# Patient Record
Sex: Male | Born: 1990 | Race: White | Hispanic: No | Marital: Single
Health system: Southern US, Community
[De-identification: ages and names within clinical notes are randomized; demographics above are authoritative.]

## PROBLEM LIST (undated history)

## (undated) DIAGNOSIS — F419 Anxiety disorder, unspecified: Secondary | ICD-10-CM

## (undated) HISTORY — PX: WRIST SURGERY: SHX841

---

## 2017-12-22 DIAGNOSIS — R030 Elevated blood-pressure reading, without diagnosis of hypertension: Secondary | ICD-10-CM | POA: Diagnosis not present

## 2017-12-22 DIAGNOSIS — R072 Precordial pain: Secondary | ICD-10-CM | POA: Diagnosis not present

## 2018-06-29 ENCOUNTER — Inpatient Hospital Stay (HOSPITAL_COMMUNITY)
Admission: EM | Admit: 2018-06-29 | Discharge: 2018-07-01 | DRG: 513 | Disposition: A | Discharge status: Home / Self Care | Payer: Commercial Managed Care - PPO | Attending: Student | Admitting: Student

## 2018-06-29 ENCOUNTER — Inpatient Hospital Stay (HOSPITAL_COMMUNITY): Payer: Commercial Managed Care - PPO | Admitting: Certified Registered"

## 2018-06-29 ENCOUNTER — Inpatient Hospital Stay (HOSPITAL_COMMUNITY): Payer: Commercial Managed Care - PPO

## 2018-06-29 ENCOUNTER — Encounter (HOSPITAL_COMMUNITY): Payer: Self-pay | Admitting: Emergency Medicine

## 2018-06-29 ENCOUNTER — Emergency Department (HOSPITAL_COMMUNITY): Payer: Commercial Managed Care - PPO

## 2018-06-29 ENCOUNTER — Encounter (HOSPITAL_COMMUNITY)
Admission: EM | Disposition: A | Discharge status: Home / Self Care | Payer: Self-pay | Source: Home / Self Care | Attending: Student

## 2018-06-29 DIAGNOSIS — S9031XA Contusion of right foot, initial encounter: Secondary | ICD-10-CM | POA: Diagnosis not present

## 2018-06-29 DIAGNOSIS — R079 Chest pain, unspecified: Secondary | ICD-10-CM | POA: Diagnosis not present

## 2018-06-29 DIAGNOSIS — S92221A Displaced fracture of lateral cuneiform of right foot, initial encounter for closed fracture: Principal | ICD-10-CM

## 2018-06-29 DIAGNOSIS — S3991XA Unspecified injury of abdomen, initial encounter: Secondary | ICD-10-CM | POA: Diagnosis not present

## 2018-06-29 DIAGNOSIS — F17220 Nicotine dependence, chewing tobacco, uncomplicated: Secondary | ICD-10-CM | POA: Diagnosis present

## 2018-06-29 DIAGNOSIS — S62022A Displaced fracture of middle third of navicular [scaphoid] bone of left wrist, initial encounter for closed fracture: Secondary | ICD-10-CM | POA: Diagnosis present

## 2018-06-29 DIAGNOSIS — Y92410 Unspecified street and highway as the place of occurrence of the external cause: Secondary | ICD-10-CM | POA: Diagnosis not present

## 2018-06-29 DIAGNOSIS — S82044C Nondisplaced comminuted fracture of right patella, initial encounter for open fracture type IIIA, IIIB, or IIIC: Secondary | ICD-10-CM | POA: Diagnosis not present

## 2018-06-29 DIAGNOSIS — S9781XA Crushing injury of right foot, initial encounter: Secondary | ICD-10-CM | POA: Diagnosis not present

## 2018-06-29 DIAGNOSIS — S62002A Unspecified fracture of navicular [scaphoid] bone of left wrist, initial encounter for closed fracture: Secondary | ICD-10-CM

## 2018-06-29 DIAGNOSIS — S81011A Laceration without foreign body, right knee, initial encounter: Secondary | ICD-10-CM

## 2018-06-29 DIAGNOSIS — M79641 Pain in right hand: Secondary | ICD-10-CM | POA: Diagnosis not present

## 2018-06-29 DIAGNOSIS — S299XXA Unspecified injury of thorax, initial encounter: Secondary | ICD-10-CM | POA: Diagnosis not present

## 2018-06-29 DIAGNOSIS — S6991XA Unspecified injury of right wrist, hand and finger(s), initial encounter: Secondary | ICD-10-CM | POA: Diagnosis not present

## 2018-06-29 DIAGNOSIS — S82044A Nondisplaced comminuted fracture of right patella, initial encounter for closed fracture: Secondary | ICD-10-CM | POA: Diagnosis not present

## 2018-06-29 DIAGNOSIS — S3993XA Unspecified injury of pelvis, initial encounter: Secondary | ICD-10-CM | POA: Diagnosis not present

## 2018-06-29 DIAGNOSIS — S92354A Nondisplaced fracture of fifth metatarsal bone, right foot, initial encounter for closed fracture: Secondary | ICD-10-CM | POA: Diagnosis not present

## 2018-06-29 DIAGNOSIS — S52502A Unspecified fracture of the lower end of left radius, initial encounter for closed fracture: Secondary | ICD-10-CM | POA: Diagnosis not present

## 2018-06-29 DIAGNOSIS — S82001B Unspecified fracture of right patella, initial encounter for open fracture type I or II: Secondary | ICD-10-CM | POA: Diagnosis not present

## 2018-06-29 DIAGNOSIS — S8991XA Unspecified injury of right lower leg, initial encounter: Secondary | ICD-10-CM | POA: Diagnosis not present

## 2018-06-29 DIAGNOSIS — S82001A Unspecified fracture of right patella, initial encounter for closed fracture: Secondary | ICD-10-CM

## 2018-06-29 DIAGNOSIS — R109 Unspecified abdominal pain: Secondary | ICD-10-CM | POA: Diagnosis not present

## 2018-06-29 DIAGNOSIS — F419 Anxiety disorder, unspecified: Secondary | ICD-10-CM | POA: Diagnosis present

## 2018-06-29 DIAGNOSIS — S6292XA Unspecified fracture of left wrist and hand, initial encounter for closed fracture: Secondary | ICD-10-CM

## 2018-06-29 DIAGNOSIS — S199XXA Unspecified injury of neck, initial encounter: Secondary | ICD-10-CM | POA: Diagnosis not present

## 2018-06-29 DIAGNOSIS — S82142A Displaced bicondylar fracture of left tibia, initial encounter for closed fracture: Secondary | ICD-10-CM

## 2018-06-29 DIAGNOSIS — S0990XA Unspecified injury of head, initial encounter: Secondary | ICD-10-CM | POA: Diagnosis not present

## 2018-06-29 HISTORY — PX: I&D EXTREMITY: SHX5045

## 2018-06-29 HISTORY — PX: ORIF SCAPHOID FRACTURE: SHX2130

## 2018-06-29 HISTORY — PX: ORIF PATELLA: SHX5033

## 2018-06-29 HISTORY — PX: IRRIGATION AND DEBRIDEMENT KNEE: SHX5185

## 2018-06-29 HISTORY — DX: Anxiety disorder, unspecified: F41.9

## 2018-06-29 HISTORY — PX: I & D EXTREMITY: SHX5045

## 2018-06-29 LAB — COMPREHENSIVE METABOLIC PANEL
ALT: 42 U/L (ref 0–44)
AST: 40 U/L (ref 15–41)
Albumin: 3.8 g/dL (ref 3.5–5.0)
Alkaline Phosphatase: 67 U/L (ref 38–126)
Anion gap: 8 (ref 5–15)
BUN: 13 mg/dL (ref 6–20)
CALCIUM: 8.9 mg/dL (ref 8.9–10.3)
CHLORIDE: 107 mmol/L (ref 98–111)
CO2: 23 mmol/L (ref 22–32)
CREATININE: 1.03 mg/dL (ref 0.61–1.24)
Glucose, Bld: 113 mg/dL — ABNORMAL HIGH (ref 70–99)
Potassium: 4.5 mmol/L (ref 3.5–5.1)
Sodium: 138 mmol/L (ref 135–145)
Total Bilirubin: 0.5 mg/dL (ref 0.3–1.2)
Total Protein: 6.5 g/dL (ref 6.5–8.1)

## 2018-06-29 LAB — CBC
HCT: 47.2 % (ref 39.0–52.0)
Hemoglobin: 15.8 g/dL (ref 13.0–17.0)
MCH: 29.9 pg (ref 26.0–34.0)
MCHC: 33.5 g/dL (ref 30.0–36.0)
MCV: 89.4 fL (ref 78.0–100.0)
PLATELETS: 303 10*3/uL (ref 150–400)
RBC: 5.28 MIL/uL (ref 4.22–5.81)
RDW: 12.6 % (ref 11.5–15.5)
WBC: 9.2 10*3/uL (ref 4.0–10.5)

## 2018-06-29 LAB — I-STAT CHEM 8, ED
BUN: 15 mg/dL (ref 6–20)
CALCIUM ION: 1.16 mmol/L (ref 1.15–1.40)
Chloride: 105 mmol/L (ref 98–111)
Creatinine, Ser: 1 mg/dL (ref 0.61–1.24)
GLUCOSE: 109 mg/dL — AB (ref 70–99)
HCT: 46 % (ref 39.0–52.0)
HEMOGLOBIN: 15.6 g/dL (ref 13.0–17.0)
POTASSIUM: 4.3 mmol/L (ref 3.5–5.1)
Sodium: 139 mmol/L (ref 135–145)
TCO2: 23 mmol/L (ref 22–32)

## 2018-06-29 LAB — ETHANOL: Alcohol, Ethyl (B): <10 mg/dL (ref <10)

## 2018-06-29 LAB — I-STAT CG4 LACTIC ACID, ED: Lactic Acid, Venous: 2.22 mmol/L (ref 0.5–1.9)

## 2018-06-29 LAB — SAMPLE TO BLOOD BANK

## 2018-06-29 LAB — PROTIME-INR
INR: 0.98
PROTHROMBIN TIME: 12.9 s (ref 11.4–15.2)

## 2018-06-29 SURGERY — IRRIGATION AND DEBRIDEMENT EXTREMITY
Anesthesia: General | Site: Wrist | Laterality: Right

## 2018-06-29 MED ORDER — PROPOFOL 10 MG/ML IV BOLUS
INTRAVENOUS | Status: DC | PRN
  Administered 2018-06-29: 200 mg via INTRAVENOUS

## 2018-06-29 MED ORDER — LACTATED RINGERS IV SOLN
INTRAVENOUS | Status: DC | PRN
  Administered 2018-06-29: 15:00:00 via INTRAVENOUS

## 2018-06-29 MED ORDER — ROCURONIUM BROMIDE 50 MG/5ML IV SOSY
PREFILLED_SYRINGE | INTRAVENOUS | Status: AC
  Filled 2018-06-29: qty 10

## 2018-06-29 MED ORDER — CEFAZOLIN SODIUM-DEXTROSE 2-4 GM/100ML-% IV SOLN
2.0000 g | Freq: Once | INTRAVENOUS | Status: AC
  Administered 2018-06-29: 2 g via INTRAVENOUS
  Filled 2018-06-29: qty 100

## 2018-06-29 MED ORDER — DEXAMETHASONE SODIUM PHOSPHATE 10 MG/ML IJ SOLN
INTRAMUSCULAR | Status: AC
  Filled 2018-06-29: qty 1

## 2018-06-29 MED ORDER — HYDROMORPHONE HCL 1 MG/ML IJ SOLN: 0.2500 mg | INTRAMUSCULAR | Status: DC | PRN

## 2018-06-29 MED ORDER — MIDAZOLAM HCL 2 MG/2ML IJ SOLN
INTRAMUSCULAR | Status: AC
  Filled 2018-06-29: qty 2

## 2018-06-29 MED ORDER — VANCOMYCIN HCL 1000 MG IV SOLR
INTRAVENOUS | Status: AC
  Filled 2018-06-29: qty 1000

## 2018-06-29 MED ORDER — FENTANYL CITRATE (PF) 250 MCG/5ML IJ SOLN
INTRAMUSCULAR | Status: AC
  Filled 2018-06-29: qty 5

## 2018-06-29 MED ORDER — FENTANYL CITRATE (PF) 100 MCG/2ML IJ SOLN
50.0000 ug | Freq: Once | INTRAMUSCULAR | Status: AC
  Administered 2018-06-29: 50 ug via INTRAVENOUS

## 2018-06-29 MED ORDER — ONDANSETRON HCL 4 MG/2ML IJ SOLN
INTRAMUSCULAR | Status: DC | PRN
  Administered 2018-06-29: 4 mg via INTRAVENOUS

## 2018-06-29 MED ORDER — SUGAMMADEX SODIUM 200 MG/2ML IV SOLN
INTRAVENOUS | Status: DC | PRN
  Administered 2018-06-29: 200 mg via INTRAVENOUS

## 2018-06-29 MED ORDER — GLYCOPYRROLATE PF 0.2 MG/ML IJ SOSY
PREFILLED_SYRINGE | INTRAMUSCULAR | Status: AC
  Filled 2018-06-29: qty 1

## 2018-06-29 MED ORDER — HYDROMORPHONE HCL 1 MG/ML IJ SOLN
0.2500 mg | INTRAMUSCULAR | Status: DC | PRN
  Administered 2018-06-29 (×2): 0.5 mg via INTRAVENOUS

## 2018-06-29 MED ORDER — IOHEXOL 300 MG/ML  SOLN
100.0000 mL | Freq: Once | INTRAMUSCULAR | Status: AC
  Administered 2018-06-29: 100 mL via INTRAVENOUS

## 2018-06-29 MED ORDER — FENTANYL CITRATE (PF) 100 MCG/2ML IJ SOLN
INTRAMUSCULAR | Status: AC
  Administered 2018-06-29: 100 ug via INTRAVENOUS
  Filled 2018-06-29: qty 2

## 2018-06-29 MED ORDER — HYDROMORPHONE HCL 1 MG/ML IJ SOLN
1.0000 mg | INTRAMUSCULAR | Status: DC | PRN
  Administered 2018-06-29 – 2018-06-30 (×3): 1 mg via INTRAVENOUS
  Filled 2018-06-29 (×4): qty 1

## 2018-06-29 MED ORDER — CEFAZOLIN SODIUM-DEXTROSE 2-4 GM/100ML-% IV SOLN
INTRAVENOUS | Status: AC
  Filled 2018-06-29: qty 100

## 2018-06-29 MED ORDER — POVIDONE-IODINE 10 % EX SWAB: 2.0000 "application " | Freq: Once | CUTANEOUS | Status: DC

## 2018-06-29 MED ORDER — FENTANYL CITRATE (PF) 100 MCG/2ML IJ SOLN
100.0000 ug | Freq: Once | INTRAMUSCULAR | Status: AC
  Administered 2018-06-29 (×2): 100 ug via INTRAVENOUS

## 2018-06-29 MED ORDER — 0.9 % SODIUM CHLORIDE (POUR BTL) OPTIME
TOPICAL | Status: DC | PRN
  Administered 2018-06-29: 1000 mL

## 2018-06-29 MED ORDER — VANCOMYCIN HCL 1000 MG IV SOLR
INTRAVENOUS | Status: DC | PRN
  Administered 2018-06-29: 1000 mg via TOPICAL

## 2018-06-29 MED ORDER — BACITRACIN ZINC 500 UNIT/GM EX OINT
TOPICAL_OINTMENT | CUTANEOUS | Status: AC
  Filled 2018-06-29: qty 28.35

## 2018-06-29 MED ORDER — PROMETHAZINE HCL 25 MG/ML IJ SOLN: 6.2500 mg | INTRAMUSCULAR | Status: DC | PRN

## 2018-06-29 MED ORDER — DEXAMETHASONE SODIUM PHOSPHATE 10 MG/ML IJ SOLN
INTRAMUSCULAR | Status: DC | PRN
  Administered 2018-06-29: 10 mg via INTRAVENOUS

## 2018-06-29 MED ORDER — ASPIRIN 325 MG PO TABS
325.0000 mg | ORAL_TABLET | Freq: Every day | ORAL | Status: DC
  Administered 2018-06-30 – 2018-07-01 (×2): 325 mg via ORAL
  Filled 2018-06-29 (×2): qty 1

## 2018-06-29 MED ORDER — ROCURONIUM BROMIDE 10 MG/ML (PF) SYRINGE
PREFILLED_SYRINGE | INTRAVENOUS | Status: DC | PRN
  Administered 2018-06-29: 20 mg via INTRAVENOUS
  Administered 2018-06-29: 50 mg via INTRAVENOUS

## 2018-06-29 MED ORDER — ACETAMINOPHEN 325 MG PO TABS: 650.0000 mg | ORAL_TABLET | Freq: Four times a day (QID) | ORAL | Status: DC | PRN

## 2018-06-29 MED ORDER — MEPERIDINE HCL 50 MG/ML IJ SOLN: 6.2500 mg | INTRAMUSCULAR | Status: DC | PRN

## 2018-06-29 MED ORDER — GLYCOPYRROLATE PF 0.2 MG/ML IJ SOSY
PREFILLED_SYRINGE | INTRAMUSCULAR | Status: DC | PRN
  Administered 2018-06-29: .2 mg via INTRAVENOUS

## 2018-06-29 MED ORDER — ENOXAPARIN SODIUM 40 MG/0.4ML ~~LOC~~ SOLN: 40.0000 mg | SUBCUTANEOUS | Status: DC

## 2018-06-29 MED ORDER — HYDROCODONE-ACETAMINOPHEN 7.5-325 MG PO TABS
ORAL_TABLET | ORAL | Status: AC
  Filled 2018-06-29: qty 1

## 2018-06-29 MED ORDER — ACETAMINOPHEN 10 MG/ML IV SOLN
1000.0000 mg | Freq: Once | INTRAVENOUS | Status: DC | PRN
  Administered 2018-06-29: 1000 mg via INTRAVENOUS

## 2018-06-29 MED ORDER — SODIUM CHLORIDE 0.9 % IR SOLN
Status: DC | PRN
  Administered 2018-06-29: 3000 mL

## 2018-06-29 MED ORDER — SODIUM CHLORIDE 0.9 % IV BOLUS
125.0000 mL | Freq: Once | INTRAVENOUS | Status: AC
  Administered 2018-06-29: 125 mL via INTRAVENOUS

## 2018-06-29 MED ORDER — KETAMINE HCL 10 MG/ML IJ SOLN
INTRAMUSCULAR | Status: DC | PRN
  Administered 2018-06-29 (×2): 20 mg via INTRAVENOUS
  Administered 2018-06-29: 10 mg via INTRAVENOUS

## 2018-06-29 MED ORDER — ACETAMINOPHEN 650 MG RE SUPP: 650.0000 mg | Freq: Four times a day (QID) | RECTAL | Status: DC | PRN

## 2018-06-29 MED ORDER — ONDANSETRON HCL 4 MG/2ML IJ SOLN
INTRAMUSCULAR | Status: AC
  Filled 2018-06-29: qty 2

## 2018-06-29 MED ORDER — HYDROMORPHONE HCL 1 MG/ML IJ SOLN
1.0000 mg | Freq: Once | INTRAMUSCULAR | Status: AC
  Administered 2018-06-29: 1 mg via INTRAVENOUS
  Filled 2018-06-29: qty 1

## 2018-06-29 MED ORDER — SUCCINYLCHOLINE CHLORIDE 200 MG/10ML IV SOSY
PREFILLED_SYRINGE | INTRAVENOUS | Status: DC | PRN
  Administered 2018-06-29: 100 mg via INTRAVENOUS

## 2018-06-29 MED ORDER — ACETAMINOPHEN 10 MG/ML IV SOLN
INTRAVENOUS | Status: AC
  Filled 2018-06-29: qty 100

## 2018-06-29 MED ORDER — SUCCINYLCHOLINE CHLORIDE 200 MG/10ML IV SOSY
PREFILLED_SYRINGE | INTRAVENOUS | Status: AC
  Filled 2018-06-29: qty 20

## 2018-06-29 MED ORDER — LIDOCAINE 2% (20 MG/ML) 5 ML SYRINGE
INTRAMUSCULAR | Status: AC
  Filled 2018-06-29: qty 5

## 2018-06-29 MED ORDER — PHENYLEPHRINE 40 MCG/ML (10ML) SYRINGE FOR IV PUSH (FOR BLOOD PRESSURE SUPPORT)
PREFILLED_SYRINGE | INTRAVENOUS | Status: AC
  Filled 2018-06-29: qty 10

## 2018-06-29 MED ORDER — METHOCARBAMOL 1000 MG/10ML IJ SOLN
500.0000 mg | Freq: Four times a day (QID) | INTRAVENOUS | Status: DC | PRN
  Filled 2018-06-29: qty 5

## 2018-06-29 MED ORDER — FENTANYL CITRATE (PF) 100 MCG/2ML IJ SOLN
INTRAMUSCULAR | Status: AC
  Administered 2018-06-29: 50 ug via INTRAVENOUS
  Filled 2018-06-29: qty 2

## 2018-06-29 MED ORDER — TOBRAMYCIN SULFATE 1.2 G IJ SOLR
INTRAMUSCULAR | Status: DC | PRN
  Administered 2018-06-29: 1.2 g via TOPICAL

## 2018-06-29 MED ORDER — PROPOFOL 10 MG/ML IV BOLUS
INTRAVENOUS | Status: AC
  Filled 2018-06-29: qty 20

## 2018-06-29 MED ORDER — CEFAZOLIN SODIUM-DEXTROSE 2-4 GM/100ML-% IV SOLN
2.0000 g | Freq: Three times a day (TID) | INTRAVENOUS | Status: AC
  Administered 2018-06-29 – 2018-06-30 (×3): 2 g via INTRAVENOUS
  Filled 2018-06-29 (×3): qty 100

## 2018-06-29 MED ORDER — CHLORHEXIDINE GLUCONATE 4 % EX LIQD: 60.0000 mL | Freq: Once | CUTANEOUS | Status: DC

## 2018-06-29 MED ORDER — HYDROCODONE-ACETAMINOPHEN 7.5-325 MG PO TABS: 1.0000 | ORAL_TABLET | Freq: Once | ORAL | Status: DC | PRN

## 2018-06-29 MED ORDER — HYDROCODONE-ACETAMINOPHEN 7.5-325 MG PO TABS
1.0000 | ORAL_TABLET | Freq: Once | ORAL | Status: AC | PRN
  Administered 2018-06-29: 1 via ORAL

## 2018-06-29 MED ORDER — KETAMINE HCL 50 MG/5ML IJ SOSY
PREFILLED_SYRINGE | INTRAMUSCULAR | Status: AC
  Filled 2018-06-29: qty 5

## 2018-06-29 MED ORDER — OXYCODONE HCL 5 MG PO TABS
5.0000 mg | ORAL_TABLET | ORAL | Status: DC | PRN
  Administered 2018-06-29: 10 mg via ORAL
  Administered 2018-06-30 (×2): 15 mg via ORAL
  Administered 2018-06-30 (×2): 10 mg via ORAL
  Administered 2018-07-01: 15 mg via ORAL
  Administered 2018-07-01: 10 mg via ORAL
  Filled 2018-06-29: qty 2
  Filled 2018-06-29 (×2): qty 3
  Filled 2018-06-29 (×2): qty 2
  Filled 2018-06-29: qty 3
  Filled 2018-06-29: qty 2
  Filled 2018-06-29: qty 3

## 2018-06-29 MED ORDER — BACITRACIN 500 UNIT/GM EX OINT
TOPICAL_OINTMENT | CUTANEOUS | Status: DC | PRN
  Administered 2018-06-29: 1 via TOPICAL

## 2018-06-29 MED ORDER — METHOCARBAMOL 500 MG PO TABS
500.0000 mg | ORAL_TABLET | Freq: Four times a day (QID) | ORAL | Status: DC | PRN
  Administered 2018-06-29 – 2018-07-01 (×4): 500 mg via ORAL
  Filled 2018-06-29 (×5): qty 1

## 2018-06-29 MED ORDER — CEFAZOLIN SODIUM-DEXTROSE 2-4 GM/100ML-% IV SOLN
2.0000 g | INTRAVENOUS | Status: AC
  Administered 2018-06-29: 2 g via INTRAVENOUS

## 2018-06-29 MED ORDER — FENTANYL CITRATE (PF) 100 MCG/2ML IJ SOLN
100.0000 ug | Freq: Once | INTRAMUSCULAR | Status: AC
  Administered 2018-06-29: 100 ug via INTRAVENOUS
  Filled 2018-06-29: qty 2

## 2018-06-29 MED ORDER — ACETAMINOPHEN 10 MG/ML IV SOLN: 1000.0000 mg | Freq: Once | INTRAVENOUS | Status: DC | PRN

## 2018-06-29 MED ORDER — HYDROMORPHONE HCL 1 MG/ML IJ SOLN
INTRAMUSCULAR | Status: AC
  Filled 2018-06-29: qty 1

## 2018-06-29 MED ORDER — MIDAZOLAM HCL 5 MG/5ML IJ SOLN
INTRAMUSCULAR | Status: DC | PRN
  Administered 2018-06-29: 2 mg via INTRAVENOUS

## 2018-06-29 MED ORDER — TOBRAMYCIN SULFATE 1.2 G IJ SOLR
INTRAMUSCULAR | Status: AC
  Filled 2018-06-29: qty 1.2

## 2018-06-29 MED ORDER — FENTANYL CITRATE (PF) 250 MCG/5ML IJ SOLN
INTRAMUSCULAR | Status: DC | PRN
  Administered 2018-06-29 (×2): 50 ug via INTRAVENOUS

## 2018-06-29 SURGICAL SUPPLY — 74 items
ADH SKN CLS APL DERMABOND .7 (GAUZE/BANDAGES/DRESSINGS)
BANDAGE ACE 4X5 VEL STRL LF (GAUZE/BANDAGES/DRESSINGS) ×6 IMPLANT
BANDAGE ACE 6X5 VEL STRL LF (GAUZE/BANDAGES/DRESSINGS) ×4 IMPLANT
BLADE CLIPPER SURG (BLADE) ×4 IMPLANT
BNDG COHESIVE 4X5 TAN STRL (GAUZE/BANDAGES/DRESSINGS) ×4 IMPLANT
BNDG GAUZE ELAST 4 BULKY (GAUZE/BANDAGES/DRESSINGS) ×8 IMPLANT
BRUSH SCRUB SURG 4.25 DISP (MISCELLANEOUS) ×8 IMPLANT
CHLORAPREP W/TINT 26ML (MISCELLANEOUS) ×6 IMPLANT
COVER MAYO STAND STRL (DRAPES) ×4 IMPLANT
COVER SURGICAL LIGHT HANDLE (MISCELLANEOUS) ×8 IMPLANT
CUFF TOURN SGL QUICK 18X4 (TOURNIQUET CUFF) ×2 IMPLANT
CUFF TOURNIQUET SINGLE 34IN LL (TOURNIQUET CUFF) ×2 IMPLANT
CUFF TOURNIQUET SINGLE 44IN (TOURNIQUET CUFF) IMPLANT
DECANTER SPIKE VIAL GLASS SM (MISCELLANEOUS) IMPLANT
DERMABOND ADVANCED (GAUZE/BANDAGES/DRESSINGS)
DERMABOND ADVANCED .7 DNX12 (GAUZE/BANDAGES/DRESSINGS) ×2 IMPLANT
DRAPE ORTHO SPLIT 77X108 STRL (DRAPES) ×4
DRAPE SURG 17X23 STRL (DRAPES) ×4 IMPLANT
DRAPE SURG ORHT 6 SPLT 77X108 (DRAPES) ×3 IMPLANT
DRAPE U-SHAPE 47X51 STRL (DRAPES) ×4 IMPLANT
DRSG ADAPTIC 3X8 NADH LF (GAUZE/BANDAGES/DRESSINGS) ×6 IMPLANT
ELECT REM PT RETURN 9FT ADLT (ELECTROSURGICAL) ×4
ELECTRODE REM PT RTRN 9FT ADLT (ELECTROSURGICAL) ×3 IMPLANT
EVACUATOR 1/8 PVC DRAIN (DRAIN) IMPLANT
GAUZE SPONGE 4X4 12PLY STRL (GAUZE/BANDAGES/DRESSINGS) ×6 IMPLANT
GLOVE BIO SURGEON STRL SZ7.5 (GLOVE) ×16 IMPLANT
GLOVE BIOGEL PI IND STRL 7.5 (GLOVE) ×4 IMPLANT
GLOVE BIOGEL PI INDICATOR 7.5 (GLOVE) ×2
GOWN STRL REUS W/ TWL LRG LVL3 (GOWN DISPOSABLE) ×6 IMPLANT
GOWN STRL REUS W/TWL LRG LVL3 (GOWN DISPOSABLE) ×8
GUIDEWIRE ORTHO MINI ACTK .045 (WIRE) ×2 IMPLANT
HANDPIECE INTERPULSE COAX TIP (DISPOSABLE) ×4
IMMOBILIZER KNEE 22 (SOFTGOODS) ×2 IMPLANT
IMMOBILIZER KNEE 24 THIGH 36 (MISCELLANEOUS) ×1 IMPLANT
IMMOBILIZER KNEE 24 UNIV (MISCELLANEOUS) ×4
KIT BASIN OR (CUSTOM PROCEDURE TRAY) ×4 IMPLANT
KIT TURNOVER KIT B (KITS) ×4 IMPLANT
MANIFOLD NEPTUNE II (INSTRUMENTS) ×4 IMPLANT
NEEDLE 22X1 1/2 (OR ONLY) (NEEDLE) IMPLANT
NS IRRIG 1000ML POUR BTL (IV SOLUTION) ×6 IMPLANT
PACK ORTHO EXTREMITY (CUSTOM PROCEDURE TRAY) ×4 IMPLANT
PAD ARMBOARD 7.5X6 YLW CONV (MISCELLANEOUS) ×8 IMPLANT
PADDING CAST COTTON 6X4 STRL (CAST SUPPLIES) ×4 IMPLANT
RETRIEVER SUT HEWSON (MISCELLANEOUS) IMPLANT
SCREW 3.5X42MM (Screw) ×2 IMPLANT
SCREW ACUTRAK 2 MICRO 14MM (Screw) ×2 IMPLANT
SCREW PELVIC CORTEX 40MM (Screw) ×2 IMPLANT
SET HNDPC FAN SPRY TIP SCT (DISPOSABLE) ×1 IMPLANT
SPONGE LAP 18X18 X RAY DECT (DISPOSABLE) ×6 IMPLANT
SUT ETHILON 2 0 FS 18 (SUTURE) ×4 IMPLANT
SUT ETHILON 3 0 FSL (SUTURE) ×4 IMPLANT
SUT ETHILON 3 0 PS 1 (SUTURE) ×8 IMPLANT
SUT FIBERWIRE #2 38 T-5 BLUE (SUTURE)
SUT FIBERWIRE #5 38 CONV NDL (SUTURE)
SUT MNCRL AB 3-0 PS2 18 (SUTURE) ×2 IMPLANT
SUT MON AB 2-0 CT1 36 (SUTURE) ×4 IMPLANT
SUT PDS AB 0 CT 36 (SUTURE) IMPLANT
SUT VIC AB 0 CT1 27 (SUTURE)
SUT VIC AB 0 CT1 27XBRD ANBCTR (SUTURE) ×2 IMPLANT
SUT VIC AB 1 CT1 27 (SUTURE)
SUT VIC AB 1 CT1 27XBRD ANBCTR (SUTURE) ×2 IMPLANT
SUT VIC AB 2-0 CT1 27 (SUTURE) ×8
SUT VIC AB 2-0 CT1 TAPERPNT 27 (SUTURE) ×4 IMPLANT
SUT VICRYL RAPIDE 4/0 PS 2 (SUTURE) ×2 IMPLANT
SUTURE FIBERWR #2 38 T-5 BLUE (SUTURE) IMPLANT
SUTURE FIBERWR #5 38 CONV NDL (SUTURE) IMPLANT
SWAB CULTURE ESWAB REG 1ML (MISCELLANEOUS) IMPLANT
SYR CONTROL 10ML LL (SYRINGE) IMPLANT
TOWEL OR 17X24 6PK STRL BLUE (TOWEL DISPOSABLE) ×4 IMPLANT
TOWEL OR 17X26 10 PK STRL BLUE (TOWEL DISPOSABLE) ×8 IMPLANT
TUBE CONNECTING 12X1/4 (SUCTIONS) ×4 IMPLANT
UNDERPAD 30X30 (UNDERPADS AND DIAPERS) ×4 IMPLANT
WATER STERILE IRR 1000ML POUR (IV SOLUTION) ×4 IMPLANT
YANKAUER SUCT BULB TIP NO VENT (SUCTIONS) ×4 IMPLANT

## 2018-06-29 NOTE — ED Notes (Signed)
Pt transported to CT ?
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
## 2018-06-29 NOTE — Anesthesia Postprocedure Evaluation (Signed)
Anesthesia Post Note  Patient: Francee Piccoloathaniel Punt  Procedure(s) Performed: IRRIGATION AND DEBRIDEMENT EXTREMITY (Right Knee) OPEN REDUCTION INTERNAL (ORIF) FIXATION PATELLA (Right Knee) OPEN REDUCTION INTERNAL FIXATION (ORIF) SCAPHOID FRACTURE (Left Wrist)     Patient location during evaluation: PACU Anesthesia Type: General Level of consciousness: awake and alert and oriented Pain management: pain level controlled Vital Signs Assessment: post-procedure vital signs reviewed and stable Respiratory status: spontaneous breathing, nonlabored ventilation and respiratory function stable Cardiovascular status: blood pressure returned to baseline and stable Postop Assessment: no apparent nausea or vomiting Anesthetic complications: no    Last Vitals:  Vitals:   06/29/18 1720 06/29/18 1745  BP: 138/85   Pulse: 87 89  Resp: 11 14  Temp:    SpO2: 99% 98%    Last Pain:  Vitals:   06/29/18 1745  TempSrc:   PainSc: Asleep                 Geza Beranek A.

## 2018-06-29 NOTE — Progress Notes (Signed)
Pt came to floor with out contacts called surgery and ED

## 2018-06-29 NOTE — Op Note (Signed)
06/29/2018  3:50 PM  PATIENT:  Erik Kennedy  27 y.o. male  PRE-OPERATIVE DIAGNOSIS: Displaced closed left scaphoid fracture  POST-OPERATIVE DIAGNOSIS:  Same  PROCEDURE: ORIF left scaphoid fracture  SURGEON: Cliffton Astersavid A. Janee Mornhompson, MD  PHYSICIAN ASSISTANT: None  ANESTHESIA:  general  SPECIMENS:  None  DRAINS:   None  EBL:  less than 50 mL  PREOPERATIVE INDICATIONS:  Erik Kennedy is a  27 y.o. male with closed displaced left scaphoid waist fracture  The risks benefits and alternatives were discussed with the patient preoperatively including but not limited to the risks of infection, bleeding, nerve injury, cardiopulmonary complications, the need for revision surgery, among others, and the patient verbalized understanding and consented to proceed.  OPERATIVE IMPLANTS: Mini Acutrak screw, 20 length  OPERATIVE PROCEDURE:  After receiving prophylactic antibiotics, the patient was escorted to the operative theatre and placed in a supine position.  General anesthesia was administered.  A surgical "time-out" was performed during which the planned procedure, proposed operative site, and the correct patient identity were compared to the operative consent and agreement confirmed by the circulating nurse according to current facility policy.  Following application of a tourniquet to the operative extremity, the exposed skin was prepped with Chloraprep and draped in the usual sterile fashion.  The limb was exsanguinated with an Esmarch bandage and the tourniquet inflated to approximately 100mmHg higher than systolic BP.  The planned incision site was marked fluoroscopically made sharply with a 15 blade.  Through this very small incision, spreading dissection was carried down to the dorsal capsule and a 16-gauge hypodermic needle used to find the entry point.  The mini Acutrak guidepin was then placed, its placement confirmed fluoroscopically and guided by the images.  Once it was placed across both  halves of the scaphoid, and appropriate length was measured and decision made to go with a 20 mm screw length.  The guidepin was then driven out the palmar surface such that it was driven far enough it was only in the distal fragment.  The wrist was then placed into extension and thumb pressure applied to the distal aspect of the scaphoid in an effort to better reduce and align the fracture.  Like this, the guidepin was driven back into the proximal fragment and as the wrist was flexed it was brought out back through the original entry site.  The guidepin was then overdrilled and the 20 mm screw placed.  Its proper advancement was confirmed fluoroscopically and the guidepin was removed.  He was advanced to its final resting position, gaining compression across the fracture as the screw became more difficult to advance.  The screwdriver was removed.  Fluoroscopy was used to ensure that the screw was intraosseous.  Reduction was acceptable.  The screw appeared to be completely intraosseous.  The tourniquet was released, additional hemostasis unnecessary and the wound was irrigated.  It was closed with a single 4-0 Vicryl Rapide interrupted suture and final images were obtained, saved, and printed.  A short arm thumb spica splint dressing was applied.  Work was ongoing for his right knee injury with Dr. Jena GaussHaddix, and I exited the operating room with him remaining prepped and draped in under general anesthesia.  DISPOSITION: He will return to the floor for continued care, only weightbearing on the left upper extremity with a platform device, otherwise restricted to paper/pencil tasks/object manipulation.  RTC 10 to 15 days with new x-rays of the left wrist out of splint, including incline lateral, and determination made  whether to proceed with removable splinting versus casting.

## 2018-06-29 NOTE — Discharge Instructions (Addendum)
Discharge Instructions--Left wrist (Dr. Janee Morn)   You have a dressing with a plaster splint incorporated in it.  NO LIFTING, GRIPPING, GRASPING WITH LEFT HAND MORE THAN PAPER/PENCIL TYPE OBJECTS) Move your fingers as much as possible, making a full fist and fully opening the fist. Elevate your hand to reduce pain & swelling of the digits.  Ice over the operative site may be helpful to reduce pain & swelling.  DO NOT USE HEAT. Leave the dressing in place until you return to our office.  You may shower, but keep the bandage clean & dry.     Please call [phone] during normal business hours or [phone] after hours for any problems. Including the following:  - excessive redness of the incisions - drainage for more than 4 days - fever of more than 101.5 F  *Please note that pain medications will not be refilled after hours or on weekends.  [facility] Discharge Instructions   General Discharge Instructions  WEIGHT BEARING STATUS: Weight bearing as tolerated to right leg in knee brace and boot  RANGE OF MOTION/ACTIVITY: Keep the right knee straight and in brace  Wound Care: Remove dressing Monday 9/16 on right knee, then follow instructions below. Do not remove dressing to left wrist  DVT/PE prophylaxis: Take a daily aspirin to prevent blood clots  Diet: as you were eating previously.  Can use over the counter stool softeners and bowel preparations, such as Miralax, to help with bowel movements.  Narcotics can be constipating.  Be sure to drink plenty of fluids  PAIN MEDICATION USE AND EXPECTATIONS  You have likely been given narcotic medications to help control your pain.  After a traumatic event that results in an fracture (broken bone) with or without surgery, it is ok to use narcotic pain medications to help control one's pain.  We understand that everyone responds to pain differently and each individual patient will be evaluated on a regular basis for the  continued need for narcotic medications. Ideally, narcotic medication use should last no more than 6-8 weeks (coinciding with fracture healing).   As a patient it is your responsibility as well to monitor narcotic medication use and report the amount and frequency you use these medications when you come to your office visit.   We would also advise that if you are using narcotic medications, you should take a dose prior to therapy to maximize you participation.  IF YOU ARE ON NARCOTIC MEDICATIONS IT IS NOT PERMISSIBLE TO OPERATE A MOTOR VEHICLE (MOTORCYCLE/CAR/TRUCK/MOPED) OR HEAVY MACHINERY DO NOT MIX NARCOTICS WITH OTHER CNS (CENTRAL NERVOUS SYSTEM) DEPRESSANTS SUCH AS ALCOHOL   STOP SMOKING OR USING NICOTINE PRODUCTS!!!!  As discussed nicotine severely impairs your body's ability to heal surgical and traumatic wounds but also impairs bone healing.  Wounds and bone heal by forming microscopic blood vessels (angiogenesis) and nicotine is a vasoconstrictor (essentially, shrinks blood vessels).  Therefore, if vasoconstriction occurs to these microscopic blood vessels they essentially disappear and are unable to deliver necessary nutrients to the healing tissue.  This is one modifiable factor that you can do to dramatically increase your chances of healing your injury.    (This means no smoking, no nicotine gum, patches, etc)  DO NOT USE NONSTEROIDAL ANTI-INFLAMMATORY DRUGS (NSAID'S)  Using products such as Advil (ibuprofen), Aleve (naproxen), Motrin (ibuprofen) for additional pain control during fracture healing can delay and/or prevent the healing response.  If you would like to take over the counter (OTC) medication, Tylenol (acetaminophen) is  ok.  However, some narcotic medications that are given for pain control contain acetaminophen as well. Therefore, you should not exceed more than 4000 mg of tylenol in a day if you do not have liver disease.  Also note that there are may OTC medicines, such as  cold medicines and allergy medicines that my contain tylenol as well.  If you have any questions about medications and/or interactions please ask your doctor/PA or your pharmacist.      ICE AND ELEVATE INJURED/OPERATIVE EXTREMITY  Using ice and elevating the injured extremity above your heart can help with swelling and pain control.  Icing in a pulsatile fashion, such as 20 minutes on and 20 minutes off, can be followed.    Do not place ice directly on skin. Make sure there is a barrier between to skin and the ice pack.    Using frozen items such as frozen peas works well as the conform nicely to the are that needs to be iced.  USE AN ACE WRAP OR TED HOSE FOR SWELLING CONTROL  In addition to icing and elevation, Ace wraps or TED hose are used to help limit and resolve swelling.  It is recommended to use Ace wraps or TED hose until you are informed to stop.    When using Ace Wraps start the wrapping distally (farthest away from the body) and wrap proximally (closer to the body)   Example: If you had surgery on your leg or thing and you do not have a splint on, start the ace wrap at the toes and work your way up to the thigh        If you had surgery on your upper extremity and do not have a splint on, start the ace wrap at your fingers and work your way up to the upper arm  IF YOU ARE IN A SPLINT OR CAST DO NOT REMOVE IT FOR ANY REASON   If your splint gets wet for any reason please contact the office immediately. You may shower in your splint or cast as long as you keep it dry.  This can be done by wrapping in a cast cover or garbage back (or similar)  Do Not stick any thing down your splint or cast such as pencils, money, or hangers to try and scratch yourself with.  If you feel itchy take benadryl as prescribed on the bottle for itching  IF YOU ARE IN A CAM BOOT (BLACK BOOT)  You may remove boot periodically. Perform daily dressing changes as noted below.  Wash the liner of the boot regularly  and wear a sock when wearing the boot. It is recommended that you sleep in the boot until told otherwise  CALL THE OFFICE WITH ANY QUESTIONS OR CONCERNS: [phone]      Discharge Wound Care Instructions  Do NOT apply any ointments, solutions or lotions to pin sites or surgical wounds.  These prevent needed drainage and even though solutions like hydrogen peroxide kill bacteria, they also damage cells lining the pin sites that help fight infection.  Applying lotions or ointments can keep the wounds moist and can cause them to breakdown and open up as well. This can increase the risk for infection. When in doubt call the office.  Surgical incisions should be dressed daily.  If any drainage is noted, use one layer of adaptic, then gauze, Kerlix, and an ace wrap.  Once the incision is completely dry and without drainage, it may be left open to  air out.  Showering may begin 36-48 hours later.  Cleaning gently with soap and water.  Traumatic wounds should be dressed daily as well.    One layer of adaptic, gauze, Kerlix, then ace wrap.  The adaptic can be discontinued once the draining has ceased    If you have a wet to dry dressing: wet the gauze with saline the squeeze as much saline out so the gauze is moist (not soaking wet), place moistened gauze over wound, then place a dry gauze over the moist one, followed by Kerlix wrap, then ace wrap.

## 2018-06-29 NOTE — Anesthesia Preprocedure Evaluation (Signed)
Anesthesia Evaluation  Patient identified by MRN, date of birth, ID band Patient awake    Reviewed: Allergy & Precautions, NPO status , Patient's Chart, lab work & pertinent test results  Airway Mallampati: II  TM Distance: >3 FB Neck ROM: Full    Dental no notable dental hx. (+) Teeth Intact, Dental Advisory Given   Pulmonary neg pulmonary ROS,    Pulmonary exam normal breath sounds clear to auscultation       Cardiovascular Normal cardiovascular exam Rhythm:Regular Rate:Normal     Neuro/Psych Anxiety negative neurological ROS     GI/Hepatic negative GI ROS, Neg liver ROS,   Endo/Other    Renal/GU negative Renal ROS     Musculoskeletal negative musculoskeletal ROS (+)   Abdominal   Peds  Hematology   Anesthesia Other Findings   Reproductive/Obstetrics                             Anesthesia Physical Anesthesia Plan  ASA: II  Anesthesia Plan: General   Post-op Pain Management:    Induction: Intravenous  PONV Risk Score and Plan: Treatment may vary due to age or medical condition, Ondansetron and Dexamethasone  Airway Management Planned: Oral ETT  Additional Equipment:   Intra-op Plan:   Post-operative Plan: Extubation in OR  Informed Consent: I have reviewed the patients History and Physical, chart, labs and discussed the procedure including the risks, benefits and alternatives for the proposed anesthesia with the patient or authorized representative who has indicated his/her understanding and acceptance.   Dental advisory given  Plan Discussed with: CRNA  Anesthesia Plan Comments:         Anesthesia Quick Evaluation
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
## 2018-06-29 NOTE — Transfer of Care (Signed)
Immediate Anesthesia Transfer of Care Note  Patient: Erik Kennedy  Procedure(s) Performed: IRRIGATION AND DEBRIDEMENT EXTREMITY (Right Knee) OPEN REDUCTION INTERNAL (ORIF) FIXATION PATELLA (Right Knee) OPEN REDUCTION INTERNAL FIXATION (ORIF) SCAPHOID FRACTURE (Left Wrist)  Patient Location: PACU  Anesthesia Type:General  Level of Consciousness: awake and alert   Airway & Oxygen Therapy: Patient Spontanous Breathing and Patient connected to nasal cannula oxygen  Post-op Assessment: Report given to RN and Post -op Vital signs reviewed and stable  Post vital signs: Reviewed and stable  Last Vitals:  Vitals Value Taken Time  BP 149/102 06/29/2018  4:32 PM  Temp    Pulse 105 06/29/2018  4:38 PM  Resp 19 06/29/2018  4:38 PM  SpO2 96 % 06/29/2018  4:38 PM  Vitals shown include unvalidated device data.  Last Pain:  Vitals:   06/29/18 1150  TempSrc:   PainSc: 10-Worst pain ever         Complications: No apparent anesthesia complications

## 2018-06-29 NOTE — Anesthesia Procedure Notes (Signed)
Procedure Name: Intubation Date/Time: 06/29/2018 2:56 PM Performed by: Elliot DallyHuggins, Lyrick Worland, CRNA Pre-anesthesia Checklist: Patient identified, Emergency Drugs available, Suction available and Patient being monitored Patient Re-evaluated:Patient Re-evaluated prior to induction Oxygen Delivery Method: Circle System Utilized Preoxygenation: Pre-oxygenation with 100% oxygen Induction Type: IV induction Laryngoscope Size: Miller and 3 Grade View: Grade II Tube type: Oral Tube size: 7.5 mm Number of attempts: 1 Airway Equipment and Method: Stylet and Oral airway Placement Confirmation: ETT inserted through vocal cords under direct vision,  positive ETCO2 and breath sounds checked- equal and bilateral Secured at: 24 cm Tube secured with: Tape Dental Injury: Teeth and Oropharynx as per pre-operative assessment
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
## 2018-06-29 NOTE — Progress Notes (Signed)
Some soft tissue swelling noted in right foot with some discoloration over top of foot noted. Doppler DP & PT pulses.

## 2018-06-29 NOTE — ED Notes (Signed)
Report given to SS RN 

## 2018-06-30 ENCOUNTER — Encounter (HOSPITAL_COMMUNITY): Payer: Self-pay | Admitting: General Practice

## 2018-06-30 ENCOUNTER — Other Ambulatory Visit: Payer: Self-pay

## 2018-06-30 LAB — CBC
HEMATOCRIT: 40.7 % (ref 39.0–52.0)
Hemoglobin: 14.2 g/dL (ref 13.0–17.0)
MCH: 30.3 pg (ref 26.0–34.0)
MCHC: 34.9 g/dL (ref 30.0–36.0)
MCV: 86.8 fL (ref 78.0–100.0)
Platelets: 287 10*3/uL (ref 150–400)
RBC: 4.69 MIL/uL (ref 4.22–5.81)
RDW: 12.2 % (ref 11.5–15.5)
WBC: 12.4 10*3/uL — ABNORMAL HIGH (ref 4.0–10.5)

## 2018-06-30 LAB — HIV ANTIBODY (ROUTINE TESTING W REFLEX): HIV SCREEN 4TH GENERATION: NONREACTIVE

## 2018-06-30 MED ORDER — ONDANSETRON HCL 4 MG/2ML IJ SOLN: 4.0000 mg | Freq: Four times a day (QID) | INTRAMUSCULAR | Status: DC | PRN

## 2018-06-30 MED ORDER — ONDANSETRON HCL 4 MG PO TABS: 4.0000 mg | ORAL_TABLET | Freq: Four times a day (QID) | ORAL | Status: DC | PRN

## 2018-06-30 MED ORDER — BACITRACIN ZINC 500 UNIT/GM EX OINT
TOPICAL_OINTMENT | Freq: Two times a day (BID) | CUTANEOUS | Status: DC
  Administered 2018-06-30 – 2018-07-01 (×3): via TOPICAL
  Filled 2018-06-30: qty 28.4

## 2018-06-30 MED ORDER — METHOCARBAMOL 750 MG PO TABS: 750.0000 mg | ORAL_TABLET | Freq: Four times a day (QID) | ORAL | 0 refills | Status: AC | PRN

## 2018-06-30 MED ORDER — ASPIRIN 325 MG PO TABS: 325.0000 mg | ORAL_TABLET | Freq: Every day | ORAL | 0 refills | Status: AC

## 2018-06-30 MED ORDER — OXYCODONE HCL 5 MG PO TABS: 5.0000 mg | ORAL_TABLET | ORAL | 0 refills | Status: AC | PRN
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
## 2018-06-30 NOTE — Plan of Care (Signed)
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
## 2018-06-30 NOTE — Progress Notes (Signed)
Orthopedic Tech Progress Note Patient Details:  Erik Kennedy Apr 11, 1991 [id number]  Ortho Devices Type of Ortho Device: CAM walker   Post Interventions Patient Tolerated: Well Instructions Provided: Care of device   Saul FordyceJennifer C Jaydis Duchene 06/30/2018, 6:27 PM

## 2018-07-01 DIAGNOSIS — S92221A Displaced fracture of lateral cuneiform of right foot, initial encounter for closed fracture: Secondary | ICD-10-CM

## 2018-07-01 DIAGNOSIS — S62002A Unspecified fracture of navicular [scaphoid] bone of left wrist, initial encounter for closed fracture: Secondary | ICD-10-CM

## 2018-07-01 LAB — CDS SEROLOGY

## 2018-07-01 NOTE — Progress Notes (Signed)
PT Cancellation Note  Patient Details Name: Erik Kennedy MRN: [id number] DOB: 09-May-1991   Cancelled Treatment:    Reason Eval/Treat Not Completed: Other (comment)  Patient declined.  Stated he ambulated and practiced stairs yesterday and did not need any further PT.  We verbally reviewed up/down stairs and signs/symptoms of concussion.  Patient mentioned he would need a note for work - Occupational hygienistnotified RN.  Will sign off.   Olivia CanterMoton, Sybrina Laning M 07/01/2018, 9:14 AM

## 2018-07-01 NOTE — Discharge Summary (Signed)
Orthopaedic Trauma Service (OTS)  Patient ID: Erik Kennedy MRN: [id number] DOB/AGE: [birth year] 27 y.o.  Admit date: 06/29/2018 Discharge date: 07/01/2018  Admission Diagnoses:Open fracture of right patella   Unspecified fracture of navicular (scaphoid) bone of left wrist, initial encounter for closed fracture   Displaced fracture of lateral cuneiform of right foot, initial encounter for closed fracture  Discharge Diagnoses:  Active Problems:   Open fracture of right patella   Unspecified fracture of navicular (scaphoid) bone of left wrist, initial encounter for closed fracture   Displaced fracture of lateral cuneiform of right foot, initial encounter for closed fracture   Past Medical History:  Diagnosis Date  . Anxiety      Procedures Performed: 06/29/2018: 1. CPT 11012-Irrigation and debridement right open patella fracture 2. CPT 27524-Open reduction internal fixation of right patella fracture   06/29/2018: Dr. Janee Morn: ORIF left scaphoid fracture  Discharged Condition: good  Hospital Course: Patient was taken urgently for the above procedures.  He was admitted following this.  He was seen and evaluated by physical therapy postoperative day 1.  He was found fit for discharge home with home health therapy.  Upon discharge she was tolerating regular diet, voiding spontaneously and pain was well controlled with an oral medication.  He was discharged on postoperative day 2 in good condition.  Consults: Hand surgery, Dr. Janee Morn  Significant Diagnostic Studies: None  Treatments: surgery: As above  Discharge Exam: General: No acute distress awake alert and oriented x3 cooperative and pleasant. Right lower extremity: Dressing taken down laceration is clean dry and intact no signs of erythema or drainage.  Compartments are soft and compressible.  Active dorsiflexion plantarflexion with intact sensation.  Warm well-perfused foot.  Left upper extremity reveals a splint that is  clean dry and intact.  He is motor and sensory function intact to median, radial and ulnar nerve distribution.  Disposition: Discharge disposition: 01-Home or Self Care        Allergies as of 07/01/2018   No Known Allergies     Medication List    STOP taking these medications   ibuprofen 200 MG tablet Commonly known as:  ADVIL,MOTRIN     TAKE these medications   aspirin 325 MG tablet Take 1 tablet (325 mg total) by mouth daily.   methocarbamol 750 MG tablet Commonly known as:  ROBAXIN Take 1 tablet (750 mg total) by mouth every 6 (six) hours as needed for muscle spasms.   oxyCODONE 5 MG immediate release tablet Commonly known as:  Oxy IR/ROXICODONE Take 1-2 tablets (5-10 mg total) by mouth every 4 (four) hours as needed.            Durable Medical Equipment  (From admission, onward)         Start     Ordered   06/30/18 1124  For home use only DME 3 n 1  Once     06/30/18 1123   06/30/18 1123  For home use only DME Walker rolling  Once    Question:  Patient needs a walker to treat with the following condition  Answer:  Left hand fracture   06/30/18 1123   06/30/18 1118  For home use only DME Walker platform  Once    Comments:  Left  Question:  Patient needs a walker to treat with the following condition  Answer:  Left hand fracture   06/30/18 1118         Follow-up Information    Schedule an appointment as soon  as possible for a visit with Mack Hookhompson, David, MD.   Specialty:  Orthopedic Surgery Why:  for 10-15 days from 06-29-18 for follow-up for your left wrist Contact information: [address] [place] [address] [phone]        Roby LoftsHaddix, Matayah Reyburn P, MD. Schedule an appointment as soon as possible for a visit in 2 week(s).   Specialty:  Orthopedic Surgery Contact information: [address] [place] [address] [phone]        [facility] - Dme Follow up.   Why:  will deliver Rolling Walker with left platform,  3n1 bedside commode to room prior to discharge Contact information: [address] [place] [address] [phone]           Discharge Instructions and Plan: Patient will be weightbearing as tolerated in the hinged knee brace and cam walking boot until follow-up.  He will be nonweightbearing to the left wrist.  He may be weightbearing as tolerated through his elbow.  He will be on an aspirin for DVT prophylaxis he will return to see me in about 2 weeks for x-rays and suture removal.  Signed:  Roby LoftsKevin P. Cainan Trull, MD [facility] 07/01/2018, 8:35 AM

## 2018-07-01 NOTE — Progress Notes (Signed)
Pt given verbal and discharge instructions. Provided work note from MD. IV removed without complication. Walker delivered to room. Pt does not have 3N1 bsc and refuses. States he has one at home.

## 2018-07-11 DIAGNOSIS — S92221D Displaced fracture of lateral cuneiform of right foot, subsequent encounter for fracture with routine healing: Secondary | ICD-10-CM | POA: Diagnosis not present

## 2018-07-11 DIAGNOSIS — S82001E Unspecified fracture of right patella, subsequent encounter for open fracture type I or II with routine healing: Secondary | ICD-10-CM | POA: Diagnosis not present

## 2018-07-11 DIAGNOSIS — S62022A Displaced fracture of middle third of navicular [scaphoid] bone of left wrist, initial encounter for closed fracture: Secondary | ICD-10-CM | POA: Diagnosis not present

## 2018-08-01 DIAGNOSIS — S92221D Displaced fracture of lateral cuneiform of right foot, subsequent encounter for fracture with routine healing: Secondary | ICD-10-CM | POA: Diagnosis not present

## 2018-08-01 DIAGNOSIS — S82001E Unspecified fracture of right patella, subsequent encounter for open fracture type I or II with routine healing: Secondary | ICD-10-CM | POA: Diagnosis not present

## 2018-08-10 DIAGNOSIS — S62022A Displaced fracture of middle third of navicular [scaphoid] bone of left wrist, initial encounter for closed fracture: Secondary | ICD-10-CM | POA: Diagnosis not present

## 2018-08-29 DIAGNOSIS — S82001E Unspecified fracture of right patella, subsequent encounter for open fracture type I or II with routine healing: Secondary | ICD-10-CM | POA: Diagnosis not present

## 2018-08-29 DIAGNOSIS — S92221D Displaced fracture of lateral cuneiform of right foot, subsequent encounter for fracture with routine healing: Secondary | ICD-10-CM | POA: Diagnosis not present

## 2018-09-07 DIAGNOSIS — S62022A Displaced fracture of middle third of navicular [scaphoid] bone of left wrist, initial encounter for closed fracture: Secondary | ICD-10-CM | POA: Diagnosis not present

## 2018-10-04 DIAGNOSIS — S62022A Displaced fracture of middle third of navicular [scaphoid] bone of left wrist, initial encounter for closed fracture: Secondary | ICD-10-CM | POA: Diagnosis not present

## 2018-11-07 DIAGNOSIS — S62022D Displaced fracture of middle third of navicular [scaphoid] bone of left wrist, subsequent encounter for fracture with routine healing: Secondary | ICD-10-CM | POA: Diagnosis not present

## 2018-11-28 DIAGNOSIS — S82001E Unspecified fracture of right patella, subsequent encounter for open fracture type I or II with routine healing: Secondary | ICD-10-CM | POA: Diagnosis not present

## 2020-05-01 IMAGING — CR DG KNEE 1-2V PORT*R*
2 series · 2 of 2 positions shown · non-contrast
Comparison: 06/29/2018

CLINICAL DATA: Status post patella surgery

EXAM:
PORTABLE RIGHT KNEE - 1-2 VIEW

[AP]
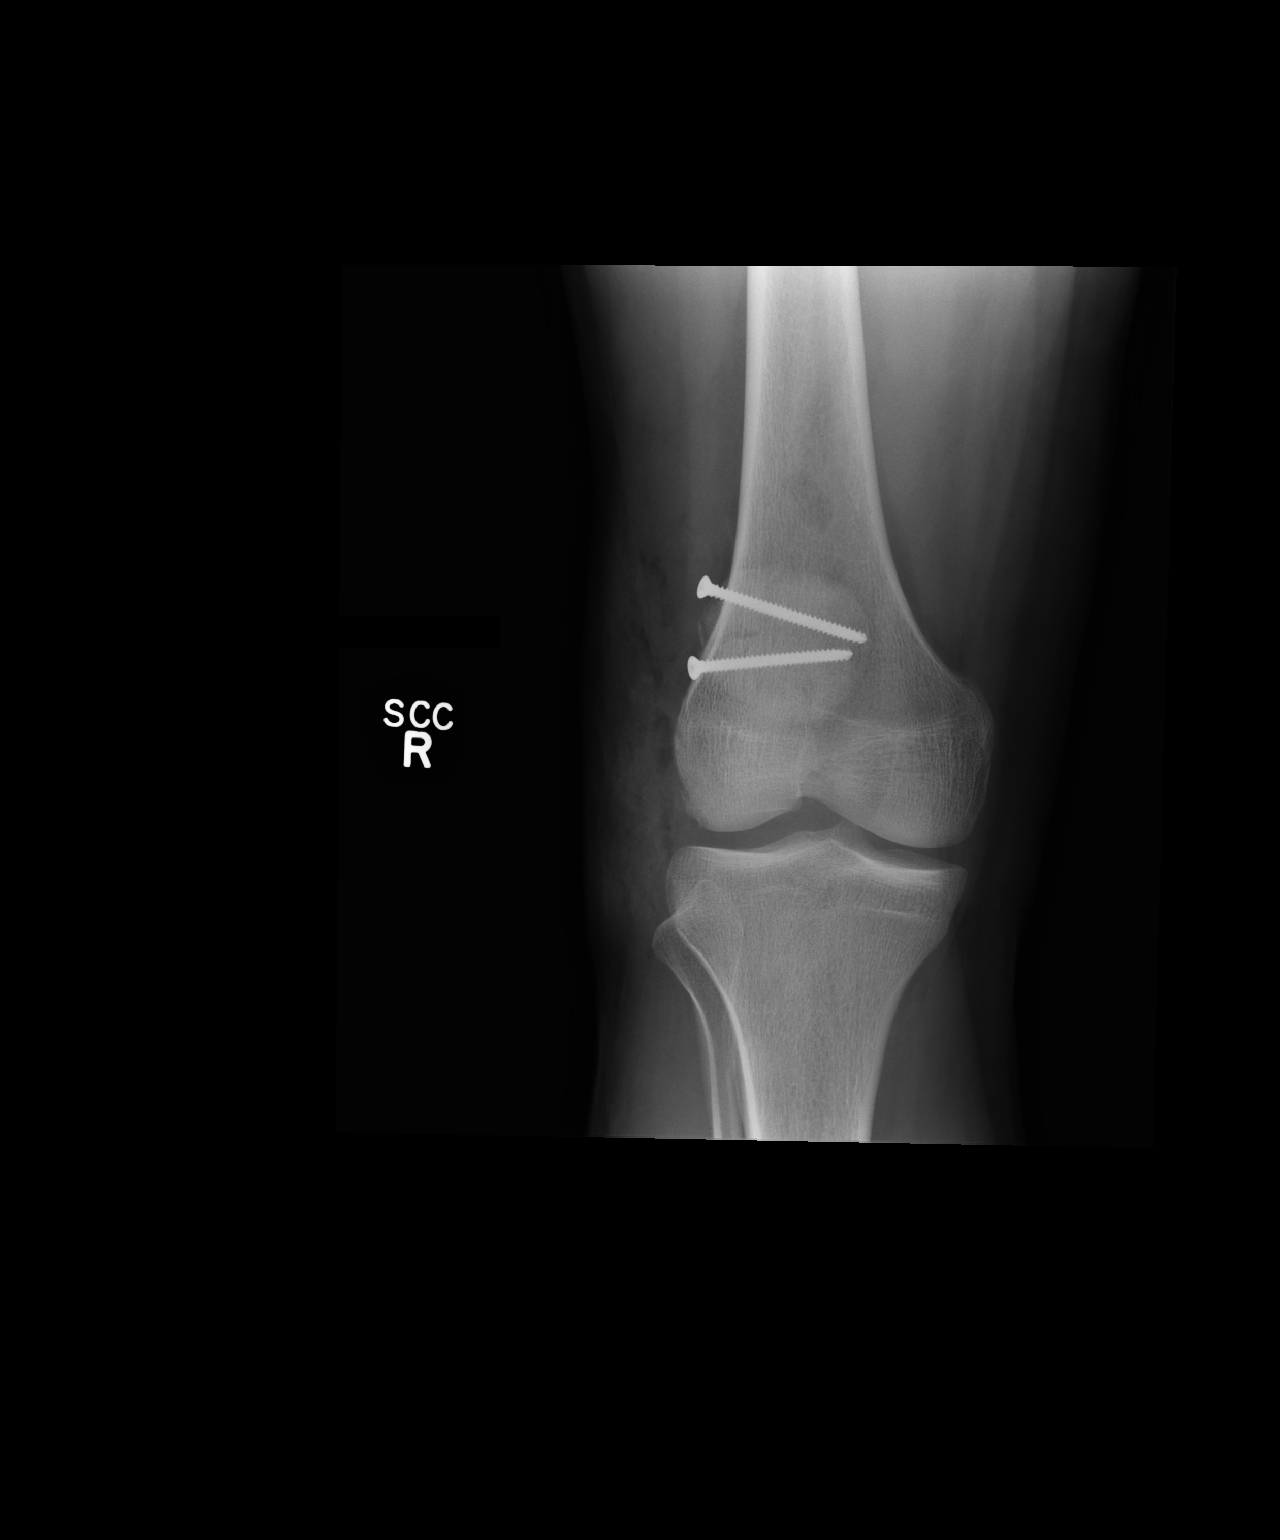

[xtable lateral]
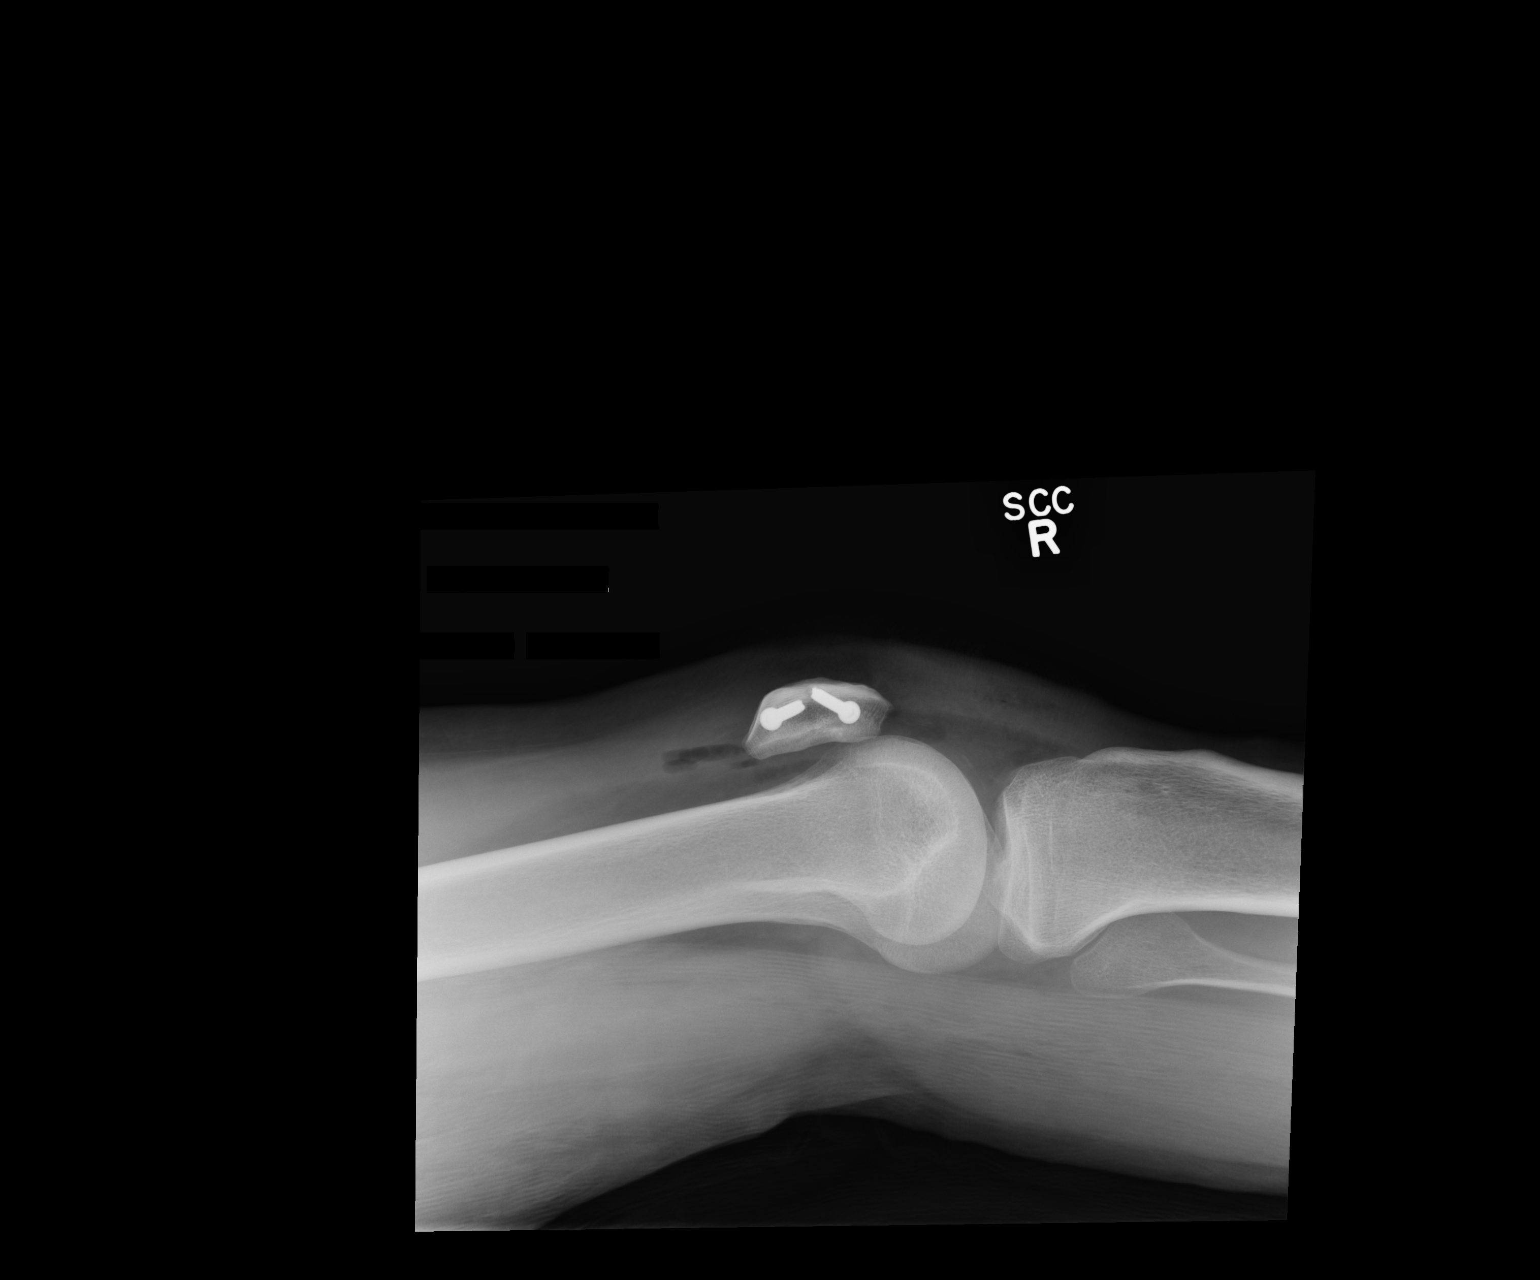

[2 of 2 positions shown; findings below may reference images not displayed]

FINDINGS: Interval screw fixation of patella fracture with anatomic alignment.
Joint spaces are maintained. Gas in the soft tissues consistent with
recent operative status
IMPRESSION: Interval screw fixation of patella fracture with expected operative
changes.
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
## 2021-02-01 ENCOUNTER — Inpatient Hospital Stay (HOSPITAL_COMMUNITY): Payer: Managed Care, Other (non HMO)

## 2021-02-01 ENCOUNTER — Emergency Department (HOSPITAL_COMMUNITY): Payer: Managed Care, Other (non HMO)

## 2021-02-01 ENCOUNTER — Other Ambulatory Visit: Payer: Self-pay

## 2021-02-01 ENCOUNTER — Encounter (HOSPITAL_COMMUNITY): Payer: Self-pay

## 2021-02-01 ENCOUNTER — Inpatient Hospital Stay (HOSPITAL_COMMUNITY)
Admission: EM | Admit: 2021-02-01 | Discharge: 2021-02-02 | DRG: 563 | Disposition: A | Discharge status: Home / Self Care | Payer: Managed Care, Other (non HMO) | Attending: General Surgery | Admitting: General Surgery

## 2021-02-01 DIAGNOSIS — Z20822 Contact with and (suspected) exposure to covid-19: Secondary | ICD-10-CM | POA: Diagnosis present

## 2021-02-01 DIAGNOSIS — S40212A Abrasion of left shoulder, initial encounter: Secondary | ICD-10-CM | POA: Diagnosis present

## 2021-02-01 DIAGNOSIS — Z23 Encounter for immunization: Secondary | ICD-10-CM

## 2021-02-01 DIAGNOSIS — S42102A Fracture of unspecified part of scapula, left shoulder, initial encounter for closed fracture: Secondary | ICD-10-CM | POA: Diagnosis present

## 2021-02-01 DIAGNOSIS — S42109A Fracture of unspecified part of scapula, unspecified shoulder, initial encounter for closed fracture: Secondary | ICD-10-CM | POA: Diagnosis present

## 2021-02-01 DIAGNOSIS — S2243XA Multiple fractures of ribs, bilateral, initial encounter for closed fracture: Secondary | ICD-10-CM | POA: Diagnosis present

## 2021-02-01 DIAGNOSIS — T1490XA Injury, unspecified, initial encounter: Secondary | ICD-10-CM

## 2021-02-01 DIAGNOSIS — Z720 Tobacco use: Secondary | ICD-10-CM | POA: Diagnosis not present

## 2021-02-01 DIAGNOSIS — S70212A Abrasion, left hip, initial encounter: Secondary | ICD-10-CM | POA: Diagnosis present

## 2021-02-01 DIAGNOSIS — S42002A Fracture of unspecified part of left clavicle, initial encounter for closed fracture: Secondary | ICD-10-CM | POA: Diagnosis present

## 2021-02-01 DIAGNOSIS — S42009A Fracture of unspecified part of unspecified clavicle, initial encounter for closed fracture: Secondary | ICD-10-CM

## 2021-02-01 LAB — I-STAT CHEM 8, ED
BUN: 21 mg/dL — ABNORMAL HIGH (ref 6–20)
Calcium, Ion: 1.08 mmol/L — ABNORMAL LOW (ref 1.15–1.40)
Chloride: 106 mmol/L (ref 98–111)
Creatinine, Ser: 1.1 mg/dL (ref 0.61–1.24)
Glucose, Bld: 186 mg/dL — ABNORMAL HIGH (ref 70–99)
HCT: 47 % (ref 39.0–52.0)
Hemoglobin: 16 g/dL (ref 13.0–17.0)
Potassium: 3.8 mmol/L (ref 3.5–5.1)
Sodium: 142 mmol/L (ref 135–145)
TCO2: 25 mmol/L (ref 22–32)

## 2021-02-01 LAB — SAMPLE TO BLOOD BANK

## 2021-02-01 LAB — COMPREHENSIVE METABOLIC PANEL
ALT: 143 U/L — ABNORMAL HIGH (ref 0–44)
AST: 135 U/L — ABNORMAL HIGH (ref 15–41)
Albumin: 4.1 g/dL (ref 3.5–5.0)
Alkaline Phosphatase: 65 U/L (ref 38–126)
Anion gap: 7 (ref 5–15)
BUN: 18 mg/dL (ref 6–20)
CO2: 26 mmol/L (ref 22–32)
Calcium: 9.2 mg/dL (ref 8.9–10.3)
Chloride: 106 mmol/L (ref 98–111)
Creatinine, Ser: 1.24 mg/dL (ref 0.61–1.24)
GFR, Estimated: >60 mL/min (ref >60)
Glucose, Bld: 187 mg/dL — ABNORMAL HIGH (ref 70–99)
Potassium: 3.9 mmol/L (ref 3.5–5.1)
Sodium: 139 mmol/L (ref 135–145)
Total Bilirubin: 0.5 mg/dL (ref 0.3–1.2)
Total Protein: 6.9 g/dL (ref 6.5–8.1)

## 2021-02-01 LAB — LACTIC ACID, PLASMA: Lactic Acid, Venous: 2.7 mmol/L (ref 0.5–1.9)

## 2021-02-01 LAB — RESP PANEL BY RT-PCR (FLU A&B, COVID) ARPGX2
Influenza A by PCR: NEGATIVE
Influenza B by PCR: NEGATIVE
SARS Coronavirus 2 by RT PCR: NEGATIVE

## 2021-02-01 LAB — CBC
HCT: 46.5 % (ref 39.0–52.0)
Hemoglobin: 16 g/dL (ref 13.0–17.0)
MCH: 30.4 pg (ref 26.0–34.0)
MCHC: 34.4 g/dL (ref 30.0–36.0)
MCV: 88.4 fL (ref 80.0–100.0)
Platelets: 312 10*3/uL (ref 150–400)
RBC: 5.26 MIL/uL (ref 4.22–5.81)
RDW: 12.7 % (ref 11.5–15.5)
WBC: 7.6 10*3/uL (ref 4.0–10.5)
nRBC: 0 % (ref 0.0–0.2)

## 2021-02-01 LAB — ETHANOL: Alcohol, Ethyl (B): <10 mg/dL (ref <10)

## 2021-02-01 LAB — PROTIME-INR
INR: 1 (ref 0.8–1.2)
Prothrombin Time: 13.3 seconds (ref 11.4–15.2)

## 2021-02-01 MED ORDER — ACETAMINOPHEN 500 MG PO TABS
1000.0000 mg | ORAL_TABLET | Freq: Four times a day (QID) | ORAL | Status: DC
  Administered 2021-02-01 – 2021-02-02 (×3): 1000 mg via ORAL
  Filled 2021-02-01 (×3): qty 2

## 2021-02-01 MED ORDER — ENOXAPARIN SODIUM 30 MG/0.3ML ~~LOC~~ SOLN: 30.0000 mg | Freq: Two times a day (BID) | SUBCUTANEOUS | Status: DC

## 2021-02-01 MED ORDER — FENTANYL CITRATE (PF) 100 MCG/2ML IJ SOLN
50.0000 ug | Freq: Once | INTRAMUSCULAR | Status: AC
  Administered 2021-02-01: 50 ug via INTRAVENOUS

## 2021-02-01 MED ORDER — ONDANSETRON 4 MG PO TBDP: 4.0000 mg | ORAL_TABLET | Freq: Four times a day (QID) | ORAL | Status: DC | PRN

## 2021-02-01 MED ORDER — MORPHINE SULFATE (PF) 4 MG/ML IV SOLN
4.0000 mg | INTRAVENOUS | Status: DC | PRN
Start: 2021-02-01 — End: 2021-02-02

## 2021-02-01 MED ORDER — METHOCARBAMOL 500 MG PO TABS
1000.0000 mg | ORAL_TABLET | Freq: Three times a day (TID) | ORAL | Status: DC
  Administered 2021-02-01 – 2021-02-02 (×3): 1000 mg via ORAL
  Filled 2021-02-01 (×3): qty 2

## 2021-02-01 MED ORDER — DOCUSATE SODIUM 100 MG PO CAPS: 100.0000 mg | ORAL_CAPSULE | Freq: Two times a day (BID) | ORAL | Status: DC

## 2021-02-01 MED ORDER — ONDANSETRON HCL 4 MG/2ML IJ SOLN: 4.0000 mg | Freq: Four times a day (QID) | INTRAMUSCULAR | Status: DC | PRN

## 2021-02-01 MED ORDER — MORPHINE SULFATE (PF) 4 MG/ML IV SOLN
4.0000 mg | Freq: Once | INTRAVENOUS | Status: AC
Start: 2021-02-01 — End: 2021-02-01
  Administered 2021-02-01: 4 mg via INTRAVENOUS
  Filled 2021-02-01: qty 1

## 2021-02-01 MED ORDER — MORPHINE SULFATE (PF) 4 MG/ML IV SOLN
4.0000 mg | Freq: Once | INTRAVENOUS | Status: AC
  Administered 2021-02-01: 4 mg via INTRAVENOUS
  Filled 2021-02-01: qty 1

## 2021-02-01 MED ORDER — IOHEXOL 300 MG/ML  SOLN
100.0000 mL | Freq: Once | INTRAMUSCULAR | Status: AC | PRN
  Administered 2021-02-01: 100 mL via INTRAVENOUS

## 2021-02-01 MED ORDER — TETANUS-DIPHTH-ACELL PERTUSSIS 5-2.5-18.5 LF-MCG/0.5 IM SUSY
0.5000 mL | PREFILLED_SYRINGE | Freq: Once | INTRAMUSCULAR | Status: AC
  Administered 2021-02-01: 0.5 mL via INTRAMUSCULAR
  Filled 2021-02-01: qty 0.5

## 2021-02-01 MED ORDER — LIDOCAINE 5 % EX PTCH
1.0000 | MEDICATED_PATCH | CUTANEOUS | Status: DC
  Administered 2021-02-01: 1 via TRANSDERMAL
  Filled 2021-02-01: qty 1

## 2021-02-01 MED ORDER — KETOROLAC TROMETHAMINE 15 MG/ML IJ SOLN
30.0000 mg | Freq: Four times a day (QID) | INTRAMUSCULAR | Status: DC
  Administered 2021-02-01 – 2021-02-02 (×3): 30 mg via INTRAVENOUS
  Filled 2021-02-01 (×3): qty 2

## 2021-02-01 MED ORDER — OXYCODONE HCL 5 MG/5ML PO SOLN
5.0000 mg | ORAL | Status: DC | PRN
  Administered 2021-02-01 – 2021-02-02 (×4): 10 mg via ORAL
  Filled 2021-02-01 (×4): qty 10

## 2021-02-01 NOTE — ED Notes (Signed)
To CT scan- delay was ct tables were full.
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
## 2021-02-01 NOTE — ED Notes (Signed)
Patient transported to CT with Trauma RN at bedside.

## 2021-02-02 ENCOUNTER — Encounter (HOSPITAL_COMMUNITY): Payer: Self-pay

## 2021-02-02 ENCOUNTER — Other Ambulatory Visit (HOSPITAL_COMMUNITY): Payer: Self-pay

## 2021-02-02 LAB — CBC
HCT: 41 % (ref 39.0–52.0)
Hemoglobin: 13.9 g/dL (ref 13.0–17.0)
MCH: 30.2 pg (ref 26.0–34.0)
MCHC: 33.9 g/dL (ref 30.0–36.0)
MCV: 89.1 fL (ref 80.0–100.0)
Platelets: 254 10*3/uL (ref 150–400)
RBC: 4.6 MIL/uL (ref 4.22–5.81)
RDW: 13 % (ref 11.5–15.5)
WBC: 7 10*3/uL (ref 4.0–10.5)
nRBC: 0 % (ref 0.0–0.2)

## 2021-02-02 LAB — HIV ANTIBODY (ROUTINE TESTING W REFLEX): HIV Screen 4th Generation wRfx: NONREACTIVE

## 2021-02-02 MED ORDER — ACETAMINOPHEN 500 MG PO TABS: 1000.0000 mg | ORAL_TABLET | Freq: Three times a day (TID) | ORAL | Status: AC | PRN

## 2021-02-02 MED ORDER — LIDOCAINE 5 % EX PTCH
1.0000 | MEDICATED_PATCH | CUTANEOUS | 0 refills | Status: AC
  Filled 2021-02-02: qty 30, 30d supply, fill #0

## 2021-02-02 MED ORDER — BACITRACIN ZINC 500 UNIT/GM EX OINT
TOPICAL_OINTMENT | Freq: Two times a day (BID) | CUTANEOUS | Status: DC
  Filled 2021-02-02: qty 28.4

## 2021-02-02 MED ORDER — OXYCODONE HCL 5 MG PO TABS
5.0000 mg | ORAL_TABLET | Freq: Four times a day (QID) | ORAL | 0 refills | Status: AC | PRN
  Filled 2021-02-02: qty 40, 5d supply, fill #0

## 2021-02-02 MED ORDER — BACITRACIN ZINC 500 UNIT/GM EX OINT
TOPICAL_OINTMENT | Freq: Two times a day (BID) | CUTANEOUS | 0 refills | Status: AC
  Filled 2021-02-02: qty 56.8, 7d supply, fill #0

## 2021-02-02 MED ORDER — METHOCARBAMOL 500 MG PO TABS
1000.0000 mg | ORAL_TABLET | Freq: Three times a day (TID) | ORAL | 0 refills | Status: AC | PRN
Start: 2021-02-02 — End: ?
  Filled 2021-02-02: qty 60, 10d supply, fill #0

## 2021-02-02 NOTE — Progress Notes (Signed)
Orthopedic Tech Progress Note Patient Details:  Erik Kennedy 30-May-1991 [id number]  Ortho Devices Type of Ortho Device: Arm sling Ortho Device/Splint Location: LUE Ortho Device/Splint Interventions: Ordered   Post Interventions Patient Tolerated: Well Instructions Provided: Care of device   Donald Pore 02/02/2021, 12:13 PM
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
## 2021-02-02 NOTE — TOC CAGE-AID Note (Signed)
Transition of Care [facility]) - CAGE-AID Screening   Patient Details  Name: Erik Kennedy MRN: [id number] Date of Birth: [birth year]   Hewitt Shorts, RN Trauma Response Nurse Phone Number: 02/02/2021, 12:21 PM       CAGE-AID Screening:    Have You Ever Felt You Ought to Cut Down on Your Drinking or Drug Use?: No Have People Annoyed You By [employer] Your Drinking Or Drug Use?: No Have You Felt Bad Or Guilty About Your Drinking Or Drug Use?: No Have You Ever Had a Drink or Used Drugs First Thing In The Morning to Steady Your Nerves or to Get Rid of a Hangover?: No CAGE-AID Score: 0  Substance Abuse Education Offered: No (Denies alcohol or drug abuse)
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
## 2021-02-02 NOTE — Progress Notes (Signed)
OT Cancellation Note  Patient Details Name: Erik Kennedy MRN: [id number] DOB: [birth year]   Cancelled Treatment:    Reason Eval/Treat Not Completed: Other (comment)- pt with pain, waiting on sling for L UE for increased comfort with ADL/mobility. Will follow.  Barry Brunner, OT Acute Rehabilitation Services Pager [phone] Office [phone]   Chancy Milroy 02/02/2021, 11:18 AM
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
## 2021-02-02 NOTE — Progress Notes (Signed)
Pt IV removed, cathter intact. Tele removed, ccmd aware. Pt has all belongings and understands d/c instructions. Pt d/c via wheelchair by NT.
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
## 2021-02-02 NOTE — Plan of Care (Signed)
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
